# Patient Record
Sex: Female | Born: 1965 | Race: Black or African American | Hispanic: No | Smoking: Current every day smoker
Health system: Southern US, Community
[De-identification: ages and names within clinical notes are randomized; demographics above are authoritative.]

## PROBLEM LIST (undated history)

## (undated) DIAGNOSIS — F419 Anxiety disorder, unspecified: Secondary | ICD-10-CM

## (undated) DIAGNOSIS — J45909 Unspecified asthma, uncomplicated: Secondary | ICD-10-CM

## (undated) DIAGNOSIS — J449 Chronic obstructive pulmonary disease, unspecified: Secondary | ICD-10-CM

## (undated) DIAGNOSIS — F329 Major depressive disorder, single episode, unspecified: Secondary | ICD-10-CM

## (undated) DIAGNOSIS — F32A Depression, unspecified: Secondary | ICD-10-CM

## (undated) HISTORY — PX: KNEE ARTHROPLASTY: SHX992

---

## 2012-01-01 ENCOUNTER — Ambulatory Visit: Payer: Self-pay

## 2012-02-01 ENCOUNTER — Other Ambulatory Visit (HOSPITAL_COMMUNITY): Payer: Self-pay | Admitting: General Practice

## 2012-02-01 ENCOUNTER — Ambulatory Visit (HOSPITAL_COMMUNITY)
Admission: RE | Admit: 2012-02-01 | Discharge: 2012-02-01 | Disposition: A | Discharge status: Home / Self Care | Payer: Disability Insurance | Source: Ambulatory Visit | Attending: General Practice | Admitting: General Practice

## 2012-02-01 DIAGNOSIS — M8569 Other cyst of bone, multiple sites: Secondary | ICD-10-CM

## 2014-10-24 ENCOUNTER — Encounter (HOSPITAL_COMMUNITY): Payer: Self-pay | Admitting: Emergency Medicine

## 2014-10-24 ENCOUNTER — Emergency Department (HOSPITAL_COMMUNITY): Payer: Disability Insurance

## 2014-10-24 ENCOUNTER — Emergency Department (HOSPITAL_COMMUNITY)
Admission: EM | Admit: 2014-10-24 | Discharge: 2014-10-24 | Disposition: A | Discharge status: Home / Self Care | Payer: Disability Insurance | Attending: Emergency Medicine | Admitting: Emergency Medicine

## 2014-10-24 DIAGNOSIS — Z7951 Long term (current) use of inhaled steroids: Secondary | ICD-10-CM | POA: Insufficient documentation

## 2014-10-24 DIAGNOSIS — M79644 Pain in right finger(s): Secondary | ICD-10-CM | POA: Insufficient documentation

## 2014-10-24 DIAGNOSIS — J449 Chronic obstructive pulmonary disease, unspecified: Secondary | ICD-10-CM | POA: Insufficient documentation

## 2014-10-24 DIAGNOSIS — Z720 Tobacco use: Secondary | ICD-10-CM | POA: Insufficient documentation

## 2014-10-24 HISTORY — DX: Unspecified asthma, uncomplicated: J45.909

## 2014-10-24 HISTORY — DX: Chronic obstructive pulmonary disease, unspecified: J44.9

## 2014-10-24 MED ORDER — HYDROCODONE-ACETAMINOPHEN 5-325 MG PO TABS
1.0000 | ORAL_TABLET | Freq: Once | ORAL | Status: AC
  Administered 2014-10-24: 1 via ORAL
  Filled 2014-10-24: qty 1

## 2014-10-24 MED ORDER — IBUPROFEN 800 MG PO TABS
800.0000 mg | ORAL_TABLET | Freq: Once | ORAL | Status: AC
  Administered 2014-10-24: 800 mg via ORAL
  Filled 2014-10-24: qty 1

## 2014-10-24 MED ORDER — HYDROCODONE-ACETAMINOPHEN 5-325 MG PO TABS: ORAL_TABLET | ORAL | Status: DC

## 2014-10-24 MED ORDER — CEPHALEXIN 500 MG PO CAPS
500.0000 mg | ORAL_CAPSULE | Freq: Once | ORAL | Status: AC
  Administered 2014-10-24: 500 mg via ORAL
  Filled 2014-10-24: qty 1

## 2014-10-24 MED ORDER — CEPHALEXIN 500 MG PO CAPS: 500.0000 mg | ORAL_CAPSULE | Freq: Four times a day (QID) | ORAL | Status: DC

## 2014-10-24 MED ORDER — IBUPROFEN 800 MG PO TABS: 800.0000 mg | ORAL_TABLET | Freq: Three times a day (TID) | ORAL | Status: DC

## 2014-10-24 NOTE — ED Notes (Signed)
Pt states she had pain to index finger on right hand last night with swelling and numbness today after what she thinks was an insect bite.

## 2014-10-26 NOTE — ED Provider Notes (Signed)
CSN: [id number]     Arrival date & time 10/24/14  2030 History   First MD Initiated Contact with Patient 10/24/14 2101     Chief Complaint  Patient presents with  . Cellulitis     (Consider location/radiation/quality/duration/timing/severity/associated sxs/prior Treatment) HPI   Kimberly Brandt is a 49 y.o. female who presents to the Emergency Department complaining of pain to the distal right index finger.  symptoms began on the night prior to arrival.  Describes a throbbing pain to the finger that is constant.  She thinks she may have been bitten by an insect although she did not see an insect.  She denies color change, swelling, numbness, or known injury.  She has not tried any therapies prior to arrival.     Past Medical History  Diagnosis Date  . Asthma   . COPD (chronic obstructive pulmonary disease)    No past surgical history on file. History reviewed. No pertinent family history. Social History  Substance Use Topics  . Smoking status: Current Every Day Smoker -- 0.50 packs/day    Types: Cigarettes  . Smokeless tobacco: None  . Alcohol Use: None   OB History    No data available     Review of Systems  Constitutional: Negative for fever and chills.  Genitourinary: Negative for dysuria and difficulty urinating.  Musculoskeletal: Positive for arthralgias (right index finger pain). Negative for joint swelling.  Skin: Negative for color change, rash and wound.  Neurological: Negative for weakness and numbness.  All other systems reviewed and are negative.     Allergies  Review of patient's allergies indicates no known allergies.  Home Medications   Prior to Admission medications   Medication Sig Start Date End Date Taking? Authorizing Provider  albuterol (PROVENTIL HFA;VENTOLIN HFA) 108 (90 BASE) MCG/ACT inhaler Inhale 2 puffs into the lungs every 6 (six) hours as needed for wheezing or shortness of breath.   Yes Historical Provider, MD  Fluticasone-Salmeterol (ADVAIR  DISKUS IN) Inhale 1 puff into the lungs daily.   Yes Historical Provider, MD  PARoxetine (PAXIL) 30 MG tablet Take 30 mg by mouth daily.   Yes Historical Provider, MD  cephALEXin (KEFLEX) 500 MG capsule Take 1 capsule (500 mg total) by mouth 4 (four) times daily. For 7 days 10/24/14   Pauline Aus, PA-C  HYDROcodone-acetaminophen (NORCO/VICODIN) 5-325 MG per tablet Take one tab po q 4-6 hrs prn pain 10/24/14   Daleah Coulson, PA-C  ibuprofen (ADVIL,MOTRIN) 800 MG tablet Take 1 tablet (800 mg total) by mouth 3 (three) times daily. 10/24/14   Drystan Reader, PA-C   BP 134/82 mmHg  Pulse 86  Temp(Src) 98.2 F (36.8 C) (Oral)  Resp 16  Ht  (1.6 m)  Wt 145 lb (65.772 kg)  BMI 25.69 kg/m2  SpO2 100%  LMP 12/20/2011 Physical Exam  Constitutional: She is oriented to person, place, and time. She appears well-developed and well-nourished. No distress.  HENT:  Head: Normocephalic and atraumatic.  Cardiovascular: Normal rate, regular rhythm and normal heart sounds.   Pulmonary/Chest: Effort normal and breath sounds normal.  Musculoskeletal: She exhibits tenderness. She exhibits no edema.  ttp of the distal end of the right index finger without obvious deformities   Radial pulse is brisk, distal sensation intact.  CR< 2 sec.  No erythema, edema or bony deformity.  Patient has full ROM. Skin warm, dry  Neurological: She is alert and oriented to person, place, and time. She exhibits normal muscle tone. Coordination normal.  Skin:  Skin is warm and dry.  Nursing note and vitals reviewed.   ED Course  Procedures (including critical care time) Labs Review Labs Reviewed - No data to display  Imaging Review Dg Finger Index Right  10/24/2014   CLINICAL DATA:  Pain and swelling beginning this morning, possible insect bite. No injury.  EXAM: RIGHT INDEX FINGER 2+V  COMPARISON:  None.  FINDINGS: There is no evidence of acute fracture or dislocation. Tiny corticated bony fragment along the DIP volar  plate may represent old avulsion injury. There is no evidence of arthropathy or other focal bone abnormality. Mild dorsal finger soft tissue swelling without subcutaneous gas or radiopaque foreign bodies.  IMPRESSION: Soft tissue swelling without acute fracture deformity or dislocation.   Electronically Signed   By: Awilda Metro M.D.   On: 10/24/2014 22:47    I have personally reviewed and evaluated these images and lab results as part of my medical decision-making.   EKG Interpretation None      MDM   Final diagnoses:  Pain of finger of right hand    Pt with pain to the right index finger for one day.  No obvious abnormalities of the finger.  NV intact.  Pain with ROM of the DIP.  XR neg.  Reports possible insect bite.  Will treat with keflex and advised pt to elevate, minimal use and close PMD f/u or to return here for any worsening sx's.  Pt agrees to plan.        Pauline Aus, PA-C 10/26/14 2251  Mancel Bale, MD 10/27/14 [id number]

## 2015-03-31 ENCOUNTER — Emergency Department (HOSPITAL_COMMUNITY): Payer: Self-pay

## 2015-03-31 ENCOUNTER — Emergency Department (HOSPITAL_COMMUNITY)
Admission: EM | Admit: 2015-03-31 | Discharge: 2015-03-31 | Disposition: A | Discharge status: Home / Self Care | Payer: Self-pay | Attending: Emergency Medicine | Admitting: Emergency Medicine

## 2015-03-31 ENCOUNTER — Encounter (HOSPITAL_COMMUNITY): Payer: Self-pay | Admitting: Emergency Medicine

## 2015-03-31 DIAGNOSIS — K2100 Gastro-esophageal reflux disease with esophagitis, without bleeding: Secondary | ICD-10-CM

## 2015-03-31 DIAGNOSIS — R079 Chest pain, unspecified: Secondary | ICD-10-CM | POA: Insufficient documentation

## 2015-03-31 DIAGNOSIS — Z7951 Long term (current) use of inhaled steroids: Secondary | ICD-10-CM | POA: Insufficient documentation

## 2015-03-31 DIAGNOSIS — F329 Major depressive disorder, single episode, unspecified: Secondary | ICD-10-CM | POA: Insufficient documentation

## 2015-03-31 DIAGNOSIS — Z792 Long term (current) use of antibiotics: Secondary | ICD-10-CM | POA: Insufficient documentation

## 2015-03-31 DIAGNOSIS — M549 Dorsalgia, unspecified: Secondary | ICD-10-CM | POA: Insufficient documentation

## 2015-03-31 DIAGNOSIS — F17210 Nicotine dependence, cigarettes, uncomplicated: Secondary | ICD-10-CM | POA: Insufficient documentation

## 2015-03-31 DIAGNOSIS — Z791 Long term (current) use of non-steroidal anti-inflammatories (NSAID): Secondary | ICD-10-CM | POA: Insufficient documentation

## 2015-03-31 DIAGNOSIS — Z79899 Other long term (current) drug therapy: Secondary | ICD-10-CM | POA: Insufficient documentation

## 2015-03-31 DIAGNOSIS — F419 Anxiety disorder, unspecified: Secondary | ICD-10-CM | POA: Insufficient documentation

## 2015-03-31 DIAGNOSIS — J441 Chronic obstructive pulmonary disease with (acute) exacerbation: Secondary | ICD-10-CM | POA: Insufficient documentation

## 2015-03-31 HISTORY — DX: Major depressive disorder, single episode, unspecified: F32.9

## 2015-03-31 HISTORY — DX: Depression, unspecified: F32.A

## 2015-03-31 HISTORY — DX: Anxiety disorder, unspecified: F41.9

## 2015-03-31 LAB — TROPONIN I
Troponin I: <0.03 ng/mL (ref <0.031)
Troponin I: <0.03 ng/mL (ref <0.031)

## 2015-03-31 LAB — CBC
HEMATOCRIT: 35.5 % — AB (ref 36.0–46.0)
HEMOGLOBIN: 11.7 g/dL — AB (ref 12.0–15.0)
MCH: 23 pg — AB (ref 26.0–34.0)
MCHC: 33 g/dL (ref 30.0–36.0)
MCV: 69.9 fL — AB (ref 78.0–100.0)
Platelets: 328 10*3/uL (ref 150–400)
RBC: 5.08 MIL/uL (ref 3.87–5.11)
RDW: 15.2 % (ref 11.5–15.5)
WBC: 5.5 10*3/uL (ref 4.0–10.5)

## 2015-03-31 LAB — COMPREHENSIVE METABOLIC PANEL
ALK PHOS: 56 U/L (ref 38–126)
ALT: 21 U/L (ref 14–54)
ANION GAP: 8 (ref 5–15)
AST: 21 U/L (ref 15–41)
Albumin: 4 g/dL (ref 3.5–5.0)
BILIRUBIN TOTAL: 0.9 mg/dL (ref 0.3–1.2)
BUN: 15 mg/dL (ref 6–20)
CALCIUM: 9.2 mg/dL (ref 8.9–10.3)
CO2: 25 mmol/L (ref 22–32)
Chloride: 105 mmol/L (ref 101–111)
Creatinine, Ser: 0.68 mg/dL (ref 0.44–1.00)
GFR calc Af Amer: >60 mL/min (ref >60)
GFR calc non Af Amer: >60 mL/min (ref >60)
Glucose, Bld: 105 mg/dL — ABNORMAL HIGH (ref 65–99)
Potassium: 3.3 mmol/L — ABNORMAL LOW (ref 3.5–5.1)
Sodium: 138 mmol/L (ref 135–145)
Total Protein: 7.7 g/dL (ref 6.5–8.1)

## 2015-03-31 MED ORDER — OMEPRAZOLE 20 MG PO CPDR: 20.0000 mg | DELAYED_RELEASE_CAPSULE | Freq: Every day | ORAL | Status: DC

## 2015-03-31 MED ORDER — PANTOPRAZOLE SODIUM 40 MG IV SOLR
40.0000 mg | Freq: Once | INTRAVENOUS | Status: AC
  Administered 2015-03-31: 40 mg via INTRAVENOUS
  Filled 2015-03-31: qty 40

## 2015-03-31 MED ORDER — GI COCKTAIL ~~LOC~~
30.0000 mL | Freq: Once | ORAL | Status: AC
  Administered 2015-03-31: 30 mL via ORAL
  Filled 2015-03-31: qty 30

## 2015-03-31 NOTE — ED Notes (Signed)
MD James at bedside.  

## 2015-03-31 NOTE — ED Notes (Signed)
Phlebotomy at bedside for second Troponin. 

## 2015-03-31 NOTE — ED Provider Notes (Signed)
CSN: [id number]     Arrival date & time 03/31/15  0845 History   By signing my name below, I, Evon Slack, attest that this documentation has been prepared under the direction and in the presence of Rolland Porter, MD. Electronically Signed: Evon Slack, ED Scribe. 03/31/2015. 9:11 AM.      Chief Complaint  Patient presents with  . Chest Pain   The history is provided by the patient. No language interpreter was used.  HPI Comments: Kimberly Brandt is a 50 y.o. female who presents to the Emergency Department complaining of recurrent sharp-burning intermittent CP onset 10 minutes PTA. She states that each episode last for about 3-5 minutes. Pt states that she had 2 episodes of the CP this morning that both have resolved on their own. Pt states that the pain radiates to her back. Pt reports that she is feeling slightly SOB but states that she has a Hx of asthma and anxiety. Pt doesn't report any medications PTA. Pt denies HX of DVT or PE. Pt denies Hx of cancer. Pt denies fever, nausea or leg swelling.     Past Medical History  Diagnosis Date  . Asthma   . COPD (chronic obstructive pulmonary disease) (HCC)   . Depression   . Anxiety    History reviewed. No pertinent past surgical history. History reviewed. No pertinent family history. Social History  Substance Use Topics  . Smoking status: Current Every Day Smoker -- 0.50 packs/day    Types: Cigarettes  . Smokeless tobacco: None  . Alcohol Use: Yes     Comment: rarely   OB History    No data available     Review of Systems  Constitutional: Negative for fever, chills, diaphoresis, appetite change and fatigue.  HENT: Negative for mouth sores, sore throat and trouble swallowing.   Eyes: Negative for visual disturbance.  Respiratory: Positive for shortness of breath. Negative for cough, chest tightness and wheezing.   Cardiovascular: Positive for chest pain.  Gastrointestinal: Negative for nausea, vomiting, abdominal pain, diarrhea and  abdominal distention.  Endocrine: Negative for polydipsia, polyphagia and polyuria.  Genitourinary: Negative for dysuria, frequency and hematuria.  Musculoskeletal: Positive for back pain. Negative for gait problem.  Skin: Negative for color change, pallor and rash.  Neurological: Negative for dizziness, syncope, light-headedness and headaches.  Hematological: Does not bruise/bleed easily.  Psychiatric/Behavioral: Negative for behavioral problems and confusion.      Allergies  Review of patient's allergies indicates no known allergies.  Home Medications   Prior to Admission medications   Medication Sig Start Date End Date Taking? Authorizing Provider  albuterol (PROVENTIL HFA;VENTOLIN HFA) 108 (90 BASE) MCG/ACT inhaler Inhale 2 puffs into the lungs every 6 (six) hours as needed for wheezing or shortness of breath.   Yes Historical Provider, MD  Fluticasone-Salmeterol (ADVAIR DISKUS IN) Inhale 1 puff into the lungs daily.   Yes Historical Provider, MD  PARoxetine (PAXIL) 30 MG tablet Take 30 mg by mouth daily.   Yes Historical Provider, MD  traMADol (ULTRAM) 50 MG tablet Take 50 mg by mouth 2 (two) times daily as needed for moderate pain.   Yes Historical Provider, MD  traZODone (DESYREL) 50 MG tablet Take 50 mg by mouth at bedtime.   Yes Historical Provider, MD  cephALEXin (KEFLEX) 500 MG capsule Take 1 capsule (500 mg total) by mouth 4 (four) times daily. For 7 days 10/24/14   Pauline Aus, PA-C  HYDROcodone-acetaminophen (NORCO/VICODIN) 5-325 MG per tablet Take one tab po q 4-6  hrs prn pain 10/24/14   Tammy Triplett, PA-C  ibuprofen (ADVIL,MOTRIN) 800 MG tablet Take 1 tablet (800 mg total) by mouth 3 (three) times daily. 10/24/14   Tammy Triplett, PA-C  omeprazole (PRILOSEC) 20 MG capsule Take 1 capsule (20 mg total) by mouth daily. 03/31/15   Rolland Porter, MD   BP 99/68 mmHg  Pulse 57  Temp(Src) 97.9 F (36.6 C) (Oral)  Resp 16  Ht  (1.6 m)  Wt 140 lb (63.504 kg)  BMI 24.81  kg/m2  SpO2 100%  LMP 12/20/2011   Physical Exam  Constitutional: She is oriented to person, place, and time. She appears well-developed and well-nourished. No distress.  Appears anxious and squirming   HENT:  Head: Normocephalic.  Eyes: Conjunctivae are normal. Pupils are equal, round, and reactive to light. No scleral icterus.  Neck: Normal range of motion. Neck supple. No thyromegaly present.  Cardiovascular: Normal rate and regular rhythm.  Exam reveals no gallop and no friction rub.   No murmur heard. Pulmonary/Chest: Effort normal and breath sounds normal. No respiratory distress. She has no wheezes. She has no rales.  Abdominal: Soft. Bowel sounds are normal. She exhibits no distension. There is no tenderness. There is no rebound.  Musculoskeletal: Normal range of motion.  Neurological: She is alert and oriented to person, place, and time.  Skin: Skin is warm and dry. No rash noted.  Psychiatric: She has a normal mood and affect. Her behavior is normal.    ED Course  Procedures (including critical care time) DIAGNOSTIC STUDIES: Oxygen Saturation is 100% on RA, normal by my interpretation.    COORDINATION OF CARE: 9:11 AM-Discussed treatment plan with pt at bedside and pt agreed to plan.    Labs Review Labs Reviewed  CBC - Abnormal; Notable for the following:    Hemoglobin 11.7 (*)    HCT 35.5 (*)    MCV 69.9 (*)    MCH 23.0 (*)    All other components within normal limits  COMPREHENSIVE METABOLIC PANEL - Abnormal; Notable for the following:    Potassium 3.3 (*)    Glucose, Bld 105 (*)    All other components within normal limits  TROPONIN I  TROPONIN I    Imaging Review Dg Chest 2 View  03/31/2015  CLINICAL DATA:  Chest and back pain with shortness of breath today, smoker, COPD, asthma EXAM: CHEST  2 VIEW COMPARISON:  None FINDINGS: Normal heart size, mediastinal contours, and pulmonary vascularity. Lungs clear. No pneumothorax. Bones unremarkable. IMPRESSION:  Normal exam. Electronically Signed   By: Ulyses Southward M.D.   On: 03/31/2015 09:36      EKG Interpretation   Date/Time:  Thursday March 31 2015 08:52:02 EST Ventricular Rate:  61 PR Interval:  189 QRS Duration: 77 QT Interval:  469 QTC Calculation: 472 R Axis:   72 Text Interpretation:  Sinus rhythm Nonspecific T abnormalities, lateral  leads Baseline wander in lead(s) V4 Confirmed by Fayrene Fearing  MD, Jeyda Siebel ([id number])  on 03/31/2015 8:56:43 AM      MDM   Final diagnoses:  Chest pain, unspecified chest pain type  Gastroesophageal reflux disease with esophagitis    EKG normal. Initial and follow troponin I normal. Symptoms consistent with reflux esophagitis.   Medical screening examination/treatment/procedure(s) were performed by non-physician practitioner and as supervising physician I was immediately available for consultation/collaboration.   EKG Interpretation   Date/Time:  Thursday March 31 2015 08:52:02 EST Ventricular Rate:  61 PR Interval:  189 QRS Duration:  77 QT Interval:  469 QTC Calculation: 472 R Axis:   72 Text Interpretation:  Sinus rhythm Nonspecific T abnormalities, lateral  leads Baseline wander in lead(s) V4 Confirmed by Fayrene Fearing  MD, Warden Buffa ([id number])  on 03/31/2015 8:56:43 AM           Rolland Porter, MD 03/31/15 1239

## 2015-03-31 NOTE — ED Notes (Signed)
MD James at bedside updating patient and family. 

## 2015-03-31 NOTE — Discharge Instructions (Signed)
Avoid large meals. No alcohol, tobacco, caffeine, or anti-inflammatory medications. Prilosec once per day. If your symptoms persist you may increase this to twice per day  Gastroesophageal Reflux Disease, Adult Normally, food travels down the esophagus and stays in the stomach to be digested. However, when a person has gastroesophageal reflux disease (GERD), food and stomach acid move back up into the esophagus. When this happens, the esophagus becomes sore and inflamed. Over time, GERD can create small holes (ulcers) in the lining of the esophagus.  CAUSES This condition is caused by a problem with the muscle between the esophagus and the stomach (lower esophageal sphincter, or LES). Normally, the LES muscle closes after food passes through the esophagus to the stomach. When the LES is weakened or abnormal, it does not close properly, and that allows food and stomach acid to go back up into the esophagus. The LES can be weakened by certain dietary substances, medicines, and medical conditions, including:  Tobacco use.  Pregnancy.  Having a hiatal hernia.  Heavy alcohol use.  Certain foods and beverages, such as coffee, chocolate, onions, and peppermint. RISK FACTORS This condition is more likely to develop in:  People who have an increased body weight.  People who have connective tissue disorders.  People who use NSAID medicines. SYMPTOMS Symptoms of this condition include:  Heartburn.  Difficult or painful swallowing.  The feeling of having a lump in the throat.  Abitter taste in the mouth.  Bad breath.  Having a large amount of saliva.  Having an upset or bloated stomach.  Belching.  Chest pain.  Shortness of breath or wheezing.  Ongoing (chronic) cough or a night-time cough.  Wearing away of tooth enamel.  Weight loss. Different conditions can cause chest pain. Make sure to see your health care provider if you experience chest pain. DIAGNOSIS Your health  care provider will take a medical history and perform a physical exam. To determine if you have mild or severe GERD, your health care provider may also monitor how you respond to treatment. You may also have other tests, including:  An endoscopy toexamine your stomach and esophagus with a small camera.  A test thatmeasures the acidity level in your esophagus.  A test thatmeasures how much pressure is on your esophagus.  A barium swallow or modified barium swallow to show the shape, size, and functioning of your esophagus. TREATMENT The goal of treatment is to help relieve your symptoms and to prevent complications. Treatment for this condition may vary depending on how severe your symptoms are. Your health care provider may recommend:  Changes to your diet.  Medicine.  Surgery. HOME CARE INSTRUCTIONS Diet  Follow a diet as recommended by your health care provider. This may involve avoiding foods and drinks such as:  Coffee and tea (with or without caffeine).  Drinks that containalcohol.  Energy drinks and sports drinks.  Carbonated drinks or sodas.  Chocolate and cocoa.  Peppermint and mint flavorings.  Garlic and onions.  Horseradish.  Spicy and acidic foods, including peppers, chili powder, curry powder, vinegar, hot sauces, and barbecue sauce.  Citrus fruit juices and citrus fruits, such as oranges, lemons, and limes.  Tomato-based foods, such as red sauce, chili, salsa, and pizza with red sauce.  Fried and fatty foods, such as donuts, french fries, potato chips, and high-fat dressings.  High-fat meats, such as hot dogs and fatty cuts of red and white meats, such as rib eye steak, sausage, ham, and bacon.  High-fat dairy  items, such as whole milk, butter, and cream cheese.  Eat small, frequent meals instead of large meals.  Avoid drinking large amounts of liquid with your meals.  Avoid eating meals during the 2-3 hours before bedtime.  Avoid lying down  right after you eat.  Do not exercise right after you eat. General Instructions  Pay attention to any changes in your symptoms.  Take over-the-counter and prescription medicines only as told by your health care provider. Do not take aspirin, ibuprofen, or other NSAIDs unless your health care provider told you to do so.  Do not use any tobacco products, including cigarettes, chewing tobacco, and e-cigarettes. If you need help quitting, ask your health care provider.  Wear loose-fitting clothing. Do not wear anything tight around your waist that causes pressure on your abdomen.  Raise (elevate) the head of your bed 6 inches (15cm).  Try to reduce your stress, such as with yoga or meditation. If you need help reducing stress, ask your health care provider.  If you are overweight, reduce your weight to an amount that is healthy for you. Ask your health care provider for guidance about a safe weight loss goal.  Keep all follow-up visits as told by your health care provider. This is important. SEEK MEDICAL CARE IF:  You have new symptoms.  You have unexplained weight loss.  You have difficulty swallowing, or it hurts to swallow.  You have wheezing or a persistent cough.  Your symptoms do not improve with treatment.  You have a hoarse voice. SEEK IMMEDIATE MEDICAL CARE IF:  You have pain in your arms, neck, jaw, teeth, or back.  You feel sweaty, dizzy, or light-headed.  You have chest pain or shortness of breath.  You vomit and your vomit looks like blood or coffee grounds.  You faint.  Your stool is bloody or black.  You cannot swallow, drink, or eat.   This information is not intended to replace advice given to you by your health care provider. Make sure you discuss any questions you have with your health care provider.   Document Released: 12/06/2004 Document Revised: 11/17/2014 Document Reviewed: 06/23/2014 Elsevier Interactive Patient Education [employer].

## 2015-03-31 NOTE — ED Notes (Signed)
Pt states that she has been having chest pain for about 20 minutes pta intermittently with shortness of breath and back pain.

## 2015-03-31 NOTE — ED Notes (Signed)
Updated pt on second Troponin lab draw at 1200. Pt verbalized understanding.

## 2015-05-14 ENCOUNTER — Emergency Department (HOSPITAL_COMMUNITY)
Admission: EM | Admit: 2015-05-14 | Discharge: 2015-05-14 | Disposition: A | Discharge status: Home / Self Care | Payer: Self-pay | Attending: Emergency Medicine | Admitting: Emergency Medicine

## 2015-05-14 ENCOUNTER — Emergency Department (HOSPITAL_COMMUNITY): Payer: Self-pay

## 2015-05-14 ENCOUNTER — Encounter (HOSPITAL_COMMUNITY): Payer: Self-pay

## 2015-05-14 DIAGNOSIS — J40 Bronchitis, not specified as acute or chronic: Secondary | ICD-10-CM | POA: Insufficient documentation

## 2015-05-14 DIAGNOSIS — Z792 Long term (current) use of antibiotics: Secondary | ICD-10-CM | POA: Insufficient documentation

## 2015-05-14 DIAGNOSIS — F17210 Nicotine dependence, cigarettes, uncomplicated: Secondary | ICD-10-CM | POA: Insufficient documentation

## 2015-05-14 DIAGNOSIS — J440 Chronic obstructive pulmonary disease with acute lower respiratory infection: Secondary | ICD-10-CM | POA: Insufficient documentation

## 2015-05-14 DIAGNOSIS — R109 Unspecified abdominal pain: Secondary | ICD-10-CM | POA: Insufficient documentation

## 2015-05-14 MED ORDER — ALBUTEROL SULFATE (2.5 MG/3ML) 0.083% IN NEBU
2.5000 mg | INHALATION_SOLUTION | Freq: Once | RESPIRATORY_TRACT | Status: AC
  Administered 2015-05-14: 2.5 mg via RESPIRATORY_TRACT
  Filled 2015-05-14: qty 3

## 2015-05-14 MED ORDER — AZITHROMYCIN 250 MG PO TABS: ORAL_TABLET | ORAL | Status: DC

## 2015-05-14 MED ORDER — IPRATROPIUM-ALBUTEROL 0.5-2.5 (3) MG/3ML IN SOLN
3.0000 mL | Freq: Once | RESPIRATORY_TRACT | Status: AC
  Administered 2015-05-14: 3 mL via RESPIRATORY_TRACT
  Filled 2015-05-14: qty 3

## 2015-05-14 MED ORDER — ALBUTEROL SULFATE HFA 108 (90 BASE) MCG/ACT IN AERS
2.0000 | INHALATION_SPRAY | RESPIRATORY_TRACT | Status: DC | PRN
  Administered 2015-05-14: 2 via RESPIRATORY_TRACT
  Filled 2015-05-14: qty 6.7

## 2015-05-14 MED ORDER — IBUPROFEN 400 MG PO TABS
600.0000 mg | ORAL_TABLET | Freq: Once | ORAL | Status: AC
  Administered 2015-05-14: 600 mg via ORAL
  Filled 2015-05-14: qty 2

## 2015-05-14 NOTE — ED Notes (Signed)
Pt c/o cough, headache, and body aches x 3 days.

## 2015-05-14 NOTE — Discharge Instructions (Signed)
Drink plenty of fluids and follow up if not improving. °

## 2015-05-14 NOTE — ED Notes (Signed)
Resp made aware of ordered breathing treatment.

## 2015-05-14 NOTE — ED Provider Notes (Signed)
CSN: [id number]     Arrival date & time 05/14/15  0915 History  By signing my name below, I, Placido Sou, attest that this documentation has been prepared under the direction and in the presence of Bethann Berkshire, MD. Electronically Signed: Placido Sou, ED Scribe. 05/14/2015. 10:08 AM.    Chief Complaint  Patient presents with  . URI   Patient is a 50 y.o. female presenting with URI. The history is provided by the patient. No language interpreter was used.  URI Presenting symptoms: cough   Presenting symptoms: no congestion and no fatigue   Severity:  Moderate Onset quality:  Gradual Duration:  3 days Timing:  Constant Progression:  Unchanged Chronicity:  Recurrent Associated symptoms: headaches, myalgias (body aches) and wheezing   Risk factors: chronic respiratory disease    HPI Comments: Kimberly Brandt is a 50 y.o. female who presents to the Emergency Department complaining of constant, moderate, dry cough onset 3 days ago. She reports associated, mild, HA, abd pain when coughing and diffuse body aches. She confirms her PMHx including COPD and still intermittently smokes. Pt denies fevers, chills or any other associated symptoms at this time.    Past Medical History  Diagnosis Date  . Asthma   . COPD (chronic obstructive pulmonary disease) (HCC)   . Depression   . Anxiety    History reviewed. No pertinent past surgical history. No family history on file. Social History  Substance Use Topics  . Smoking status: Current Every Day Smoker -- 0.50 packs/day    Types: Cigarettes  . Smokeless tobacco: None  . Alcohol Use: Yes     Comment: rarely   OB History    No data available     Review of Systems  Constitutional: Negative for chills, appetite change and fatigue.  HENT: Negative for congestion, ear discharge and sinus pressure.   Eyes: Negative for discharge.  Respiratory: Positive for cough and wheezing.   Cardiovascular: Negative for chest pain.  Gastrointestinal:  Positive for abdominal pain. Negative for diarrhea.  Genitourinary: Negative for frequency and hematuria.  Musculoskeletal: Positive for myalgias (body aches). Negative for back pain.  Skin: Negative for rash.  Neurological: Positive for headaches. Negative for seizures.  Psychiatric/Behavioral: Negative for hallucinations.    Allergies  Review of patient's allergies indicates no known allergies.  Home Medications   Prior to Admission medications   Medication Sig Start Date End Date Taking? Authorizing Provider  albuterol (PROVENTIL HFA;VENTOLIN HFA) 108 (90 BASE) MCG/ACT inhaler Inhale 2 puffs into the lungs every 6 (six) hours as needed for wheezing or shortness of breath.    Historical Provider, MD  cephALEXin (KEFLEX) 500 MG capsule Take 1 capsule (500 mg total) by mouth 4 (four) times daily. For 7 days 10/24/14   Tammy Triplett, PA-C  Fluticasone-Salmeterol (ADVAIR DISKUS IN) Inhale 1 puff into the lungs daily.    Historical Provider, MD  HYDROcodone-acetaminophen (NORCO/VICODIN) 5-325 MG per tablet Take one tab po q 4-6 hrs prn pain 10/24/14   Tammy Triplett, PA-C  ibuprofen (ADVIL,MOTRIN) 800 MG tablet Take 1 tablet (800 mg total) by mouth 3 (three) times daily. 10/24/14   Tammy Triplett, PA-C  omeprazole (PRILOSEC) 20 MG capsule Take 1 capsule (20 mg total) by mouth daily. 03/31/15   Rolland Porter, MD  PARoxetine (PAXIL) 30 MG tablet Take 30 mg by mouth daily.    Historical Provider, MD  traMADol (ULTRAM) 50 MG tablet Take 50 mg by mouth 2 (two) times daily as needed for moderate pain.  Historical Provider, MD  traZODone (DESYREL) 50 MG tablet Take 50 mg by mouth at bedtime.    Historical Provider, MD   BP 103/67 mmHg  Pulse 99  Temp(Src) 98.9 F (37.2 C) (Oral)  Resp 20  Ht 5\' 3"  (1.6 m)  Wt 140 lb (63.504 kg)  BMI 24.81 kg/m2  SpO2 100%  LMP 12/20/2011    Physical Exam  Constitutional: She is oriented to person, place, and time. She appears well-developed.  HENT:  Head:  Normocephalic.  Eyes: Conjunctivae and EOM are normal. No scleral icterus.  Neck: Neck supple. No thyromegaly present.  Cardiovascular: Normal rate and regular rhythm.  Exam reveals no gallop and no friction rub.   No murmur heard. Pulmonary/Chest: No stridor. She has wheezes (moderate wheezing bilaterally). She has no rales. She exhibits no tenderness.  Abdominal: She exhibits no distension. There is no tenderness. There is no rebound.  Musculoskeletal: Normal range of motion. She exhibits no edema.  Lymphadenopathy:    She has no cervical adenopathy.  Neurological: She is oriented to person, place, and time. She exhibits normal muscle tone. Coordination normal.  Skin: No rash noted. No erythema.  Psychiatric: She has a normal mood and affect. Her behavior is normal.    ED Course  Procedures  DIAGNOSTIC STUDIES: Oxygen Saturation is 100% on RA, normal by my interpretation.    COORDINATION OF CARE: 10:06 AM Discussed next steps with pt and she agreed to the plan.   Labs Review Labs Reviewed - No data to display  Imaging Review No results found. I have personally reviewed and evaluated these images as part of my medical decision-making.   EKG Interpretation None      MDM   Final diagnoses:  None    Patient with bronchitis and bronchospasm she is discharged with Z-Pak and albuterol inhaler  Bethann BerkshireJoseph Kiera Hussey, MD 05/14/15 1208

## 2015-09-12 ENCOUNTER — Ambulatory Visit (HOSPITAL_COMMUNITY): Payer: Self-pay | Attending: Emergency Medicine

## 2015-09-12 ENCOUNTER — Encounter (HOSPITAL_COMMUNITY): Payer: Self-pay

## 2015-09-12 DIAGNOSIS — R278 Other lack of coordination: Secondary | ICD-10-CM

## 2015-09-12 DIAGNOSIS — M25532 Pain in left wrist: Secondary | ICD-10-CM

## 2015-09-12 NOTE — Patient Instructions (Signed)
  Do exercises 2-3 times a day for 3-4 weeks.  WRIST EXTENSION CURLS - TABLE  Hold a small free weight, rest your forearm on a table and bend your wrist up and down with your palm face down as shown.    Complete 10 times.   ANTERIOR/MIDDLE SCALENE STRETCH - HOLDING CHAIR  While sitting in a chair, hold the seat with one hand. Next, tilt your head to the opposite side and then rotate your head upward. Hold for a stretch. Return to original position and then repeat. Tip your chin upward to intensify the stretch.   Hold 10 seconds. Repeat 1 time.   SCAPULAR RETRACTIONS  Draw your shoulder blades back and down. Complete 10 times.     LEVATOR SCAPULAE STRETCH - GRASPING WRIST  Grasp your arm of the affected side and pull it gently towards the opposite side in front of your body.  Next, tilt your head downward and to the side looking away from the affected side until a stretch is felt.    Hold 10 seconds. Complete 1 time.  WRIST EXTENSOR STRETCH  Use your unaffected hand to bend the affected wrist down as shown.   Keep the elbow straight on the affected side the entire time.  Hold 10 seconds.   WRIST FLEXOR STRETCH  Use your unaffected hand to bend the affected wrist up as shown.   Keep the elbow straight on the affected side the entire time.   Hold 10 seconds.   Wrist flexion/extension  Bend at the wrist to move hand forward and then bend wrist to move hand back like shown    Complete 10 times.

## 2015-09-12 NOTE — Therapy (Signed)
[facility] [address] [place], [place], [address] Phone: [phone]   Fax:  [phone]  Occupational Therapy Evaluation  Patient Details  Name: Kimberly Brandt MRN: [id number] Date of Birth: 05/09/1965 Referring Provider: Smith RobertStephen Kikel, MD  Encounter Date: 09/12/2015      OT End of Session - 09/12/15 1847    Visit Number 1   Number of Visits 1   Authorization Type Medicaid   OT Start Time 1730   OT Stop Time 1815   OT Time Calculation (min) 45 min   Activity Tolerance Patient tolerated treatment well   Behavior During Therapy [facility] for tasks assessed/performed      Past Medical History  Diagnosis Date  . Asthma   . COPD (chronic obstructive pulmonary disease) (HCC)   . Depression   . Anxiety     No past surgical history on file.  There were no vitals filed for this visit.      Subjective Assessment - 09/12/15 1747    Subjective  S: I just want this pain and numbness to go away.   Pertinent History Patient is a 50 y/o female S/P left wrist pain due to carpal tunnel syndrome which began a few months ago. Patient reports that she does have a night time wrist splint although the strap has broken and she needs a new one. Pain and numbness is constant and severe according to patient. Dr. Bryna ColanderKikel has referred patient to occupational therapy for evaluation and treatment.    Limitations Unable to use Left hand for any daily tasks.   Patient Stated Goals To be pain free if possible.   Currently in Pain? Yes   Pain Score 7    Pain Location Wrist   Pain Orientation Left   Pain Descriptors / Indicators Throbbing   Pain Type Acute pain   Pain Radiating Towards radiates from wrist up arm.    Pain Onset More than a month ago   Pain Frequency Constant   Aggravating Factors  Use and movement.   Pain Relieving Factors Nothing helps.   Effect of Pain on Daily Activities Major effect on daily tasks.    Multiple Pain Sites No            [facility] OT Assessment - 09/12/15 1748    Assessment   Diagnosis Left wrist pain (Carpal Tunnel Syndrome)   Referring Provider Smith RobertStephen Kikel, MD   Onset Date --  a few months ago   Prior Therapy None   Precautions   Precautions None   Restrictions   Weight Bearing Restrictions No   Balance Screen   Has the patient fallen in the past 6 months No   Home  Environment   Family/patient expects to be discharged to: Private residence   Prior Function   Level of Independence Independent   Vocation Retired   ADL   ADL comments Severe difficulty using left hand for any task. Patient reports that she is unable to open or hold onto a jar to open using left hand.    Mobility   Mobility Status Independent   Written Expression   Dominant Hand Right   Vision - History   Baseline Vision No visual deficits   Cognition   Overall Cognitive Status Within Functional Limits for tasks assessed   Sensation   Light Touch Impaired by gross assessment   Stereognosis Impaired by gross assessment   Additional Comments Pt reports that feeling that her hand is asleep.  Coordination   Gross Motor Movements are Fluid and Coordinated No   Fine Motor Movements are Fluid and Coordinated No   ROM / Strength   AROM / PROM / Strength PROM;Strength;AROM   AROM   Overall AROM  Within functional limits for tasks performed   AROM Assessment Site Wrist;Forearm;Elbow;Shoulder   Right/Left Shoulder Left   Right/Left Elbow Left   Right/Left Forearm Left   Right/Left Wrist Left   Strength   Overall Strength Comments Assessed seated.   Strength Assessment Site Hand   Right/Left hand Left;Right   Right Hand Grip (lbs) 55   Right Hand Lateral Pinch 16 lbs   Right Hand 3 Point Pinch 14 lbs   Left Hand Grip (lbs) 10   Left Hand Lateral Pinch 4 lbs   Left Hand 3 Point Pinch 2 lbs                         OT Education - 09/12/15 1843    Education provided Yes   Education Details Carpal tunnel  syndrome exercises and stretches. patient was also given a piece of Dycem to assist with holding items when opening.    Person(s) Educated Patient   Methods Explanation;Demonstration;Verbal cues;Handout   Comprehension Verbalized understanding;Returned demonstration          OT Short Term Goals - 09/12/15 1849    OT SHORT TERM GOAL #1   Title Patient will be educated and independent with HEP for left wrist pain.   Time 1   Period Days   Status Achieved                  Plan - 09/12/15 1847    Clinical Impression Statement A: Patient is a 50 y/o female S/P left wrist pain causing increased pain and numbness and decreased strength resulting in difficulty completing daily tasks using LUE. Pt's insurance only covers 1 OT evaluation. Pt is unable to self pay and wishes to be given a HEP to complet at home.     Rehab Potential Good   OT Frequency One time visit   OT Treatment/Interventions Patient/family education   Plan P: 1 time visit with HEP.   Consulted and Agree with Plan of Care Patient      Patient will benefit from skilled therapeutic intervention in order to improve the following deficits and impairments:  Pain, Impaired sensation, Decreased strength, Decreased coordination, Impaired UE functional use  Visit Diagnosis: Pain in left wrist - Plan: Ot plan of care cert/re-cert  Other lack of coordination - Plan: Ot plan of care cert/re-cert    Problem List There are no active problems to display for this patient.   Limmie PatriciaLaura Mikiya Nebergall, OTR/L,CBIS  [phone]  09/12/2015, 6:51 PM  [facility] [address] [place], [place], [address] Phone: [phone]   Fax:  [phone]  Name: Kimberly Brandt MRN: [id number] Date of Birth: 11/16/1965

## 2015-09-28 ENCOUNTER — Ambulatory Visit: Payer: Medicaid Other | Admitting: Orthopedic Surgery

## 2017-12-26 IMAGING — DX DG CHEST 2V
2 series · 2 of 2 positions shown · non-contrast
Comparison: None

CLINICAL DATA: Chest and back pain with shortness of breath today,
smoker, COPD, asthma

EXAM:
CHEST  2 VIEW

[chest pa]
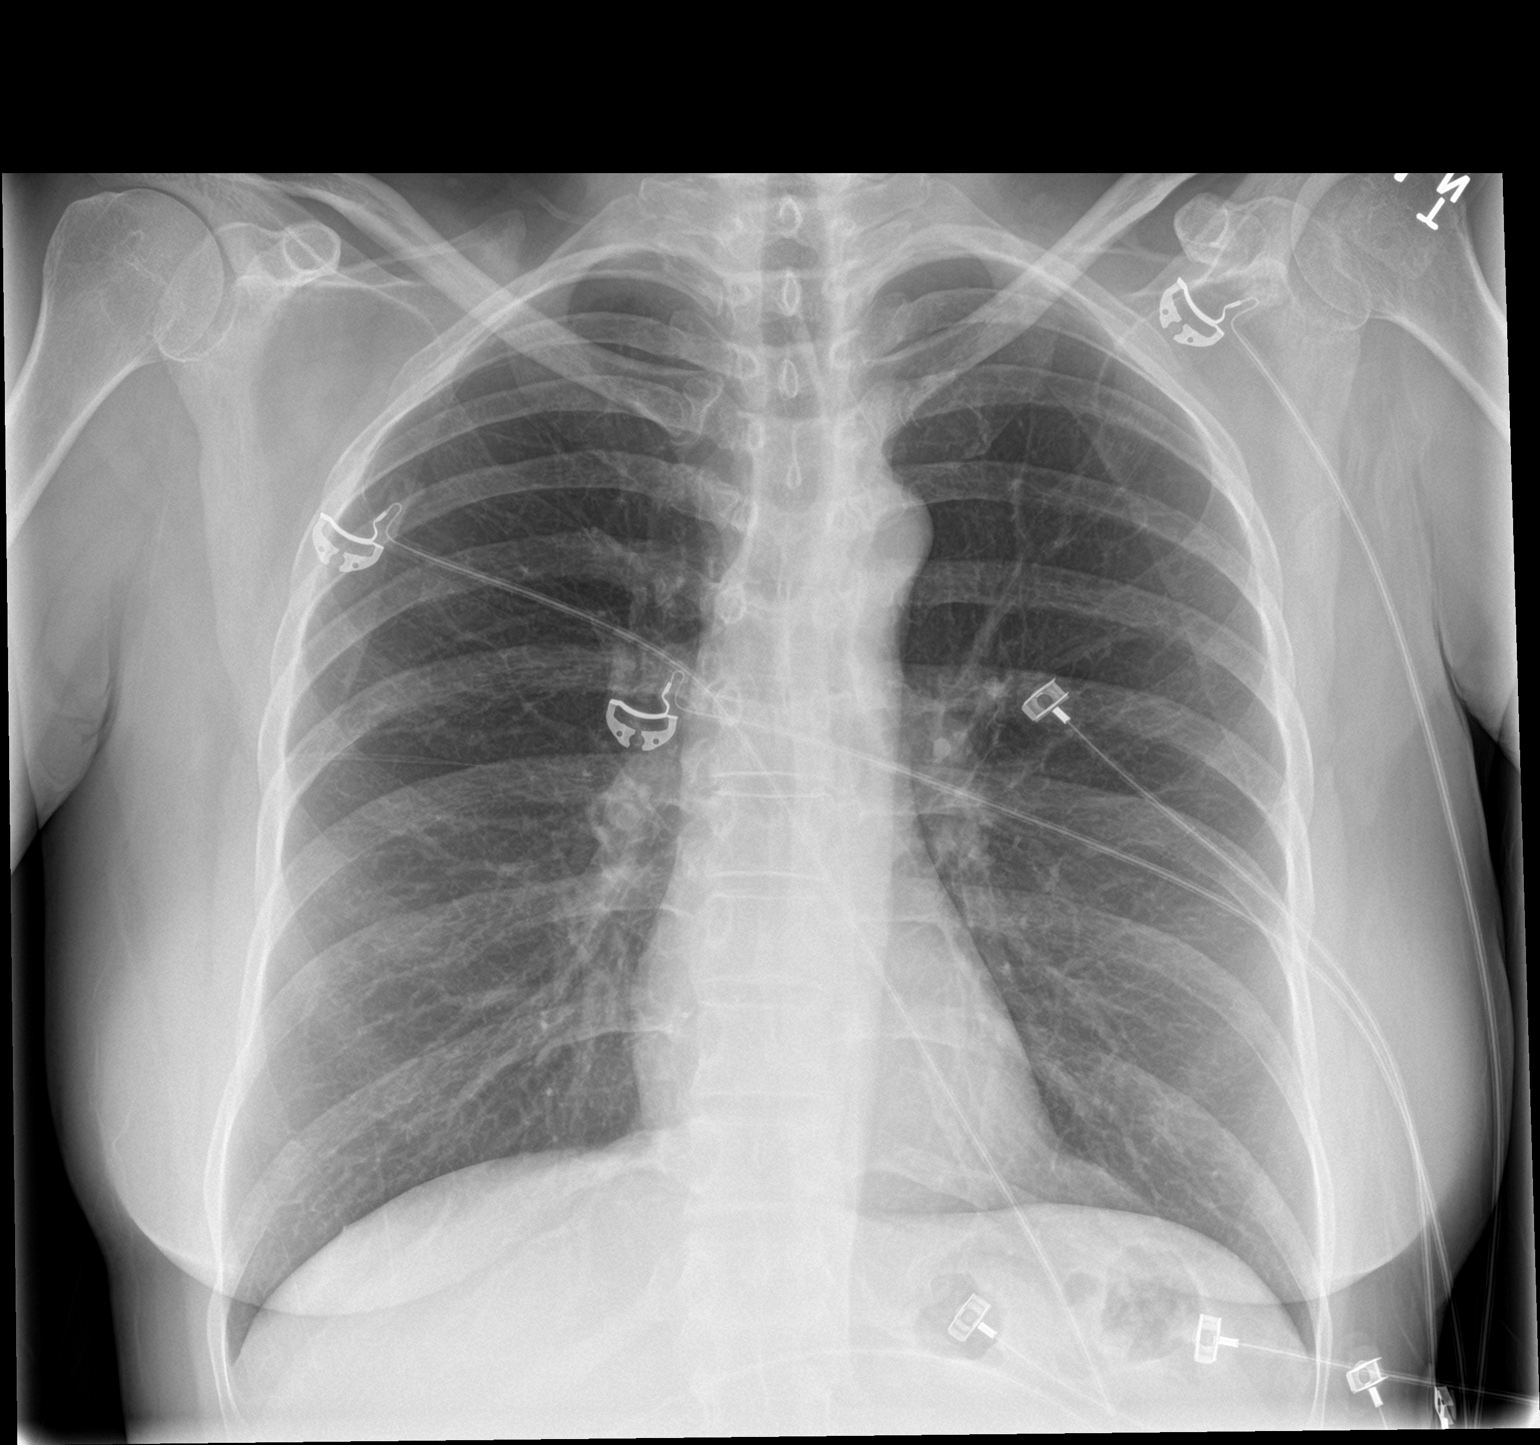

[chest lat]
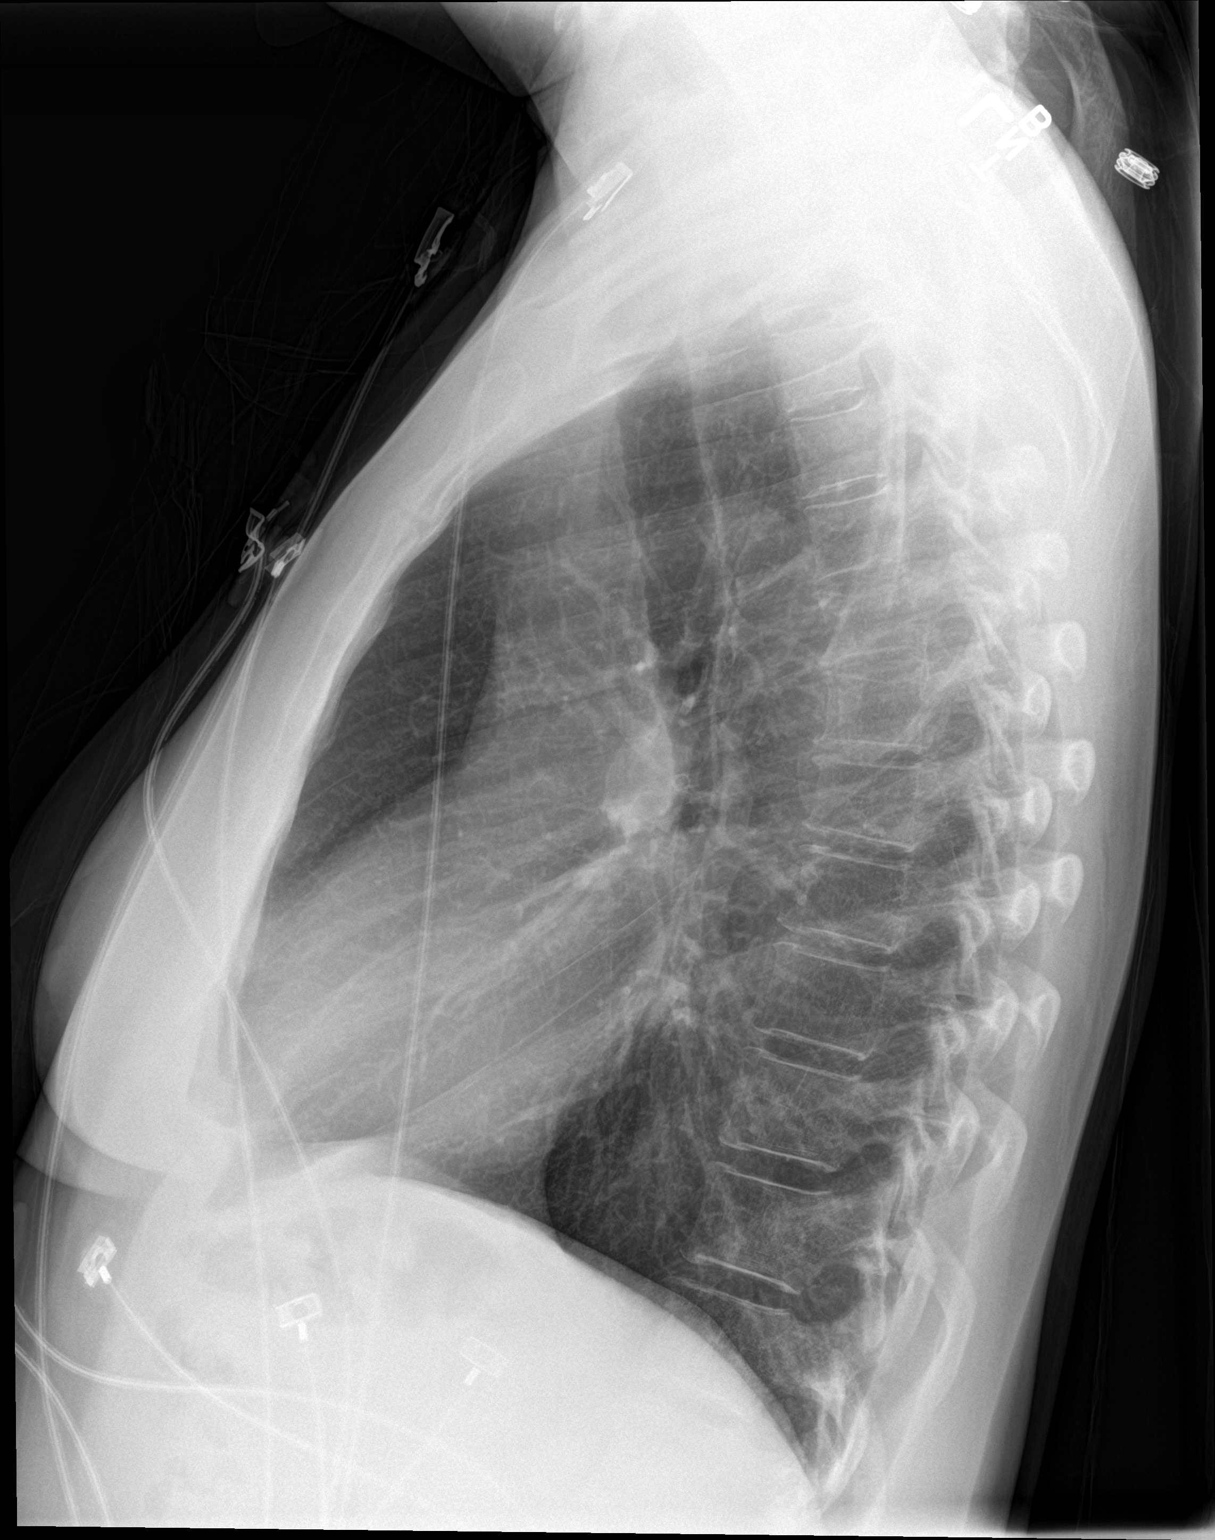

[2 of 2 positions shown; findings below may reference images not displayed]

FINDINGS: Normal heart size, mediastinal contours, and pulmonary vascularity.

Lungs clear.

No pneumothorax.

Bones unremarkable.
IMPRESSION: Normal exam.

## 2018-02-08 IMAGING — DX DG CHEST 2V
2 series · 2 of 2 positions shown · non-contrast
Comparison: 03/30/2005

CLINICAL DATA: Cough, headaches and body aches for 3 days, history
asthma, COPD, smoking

EXAM:
CHEST  2 VIEW

[chest pa]
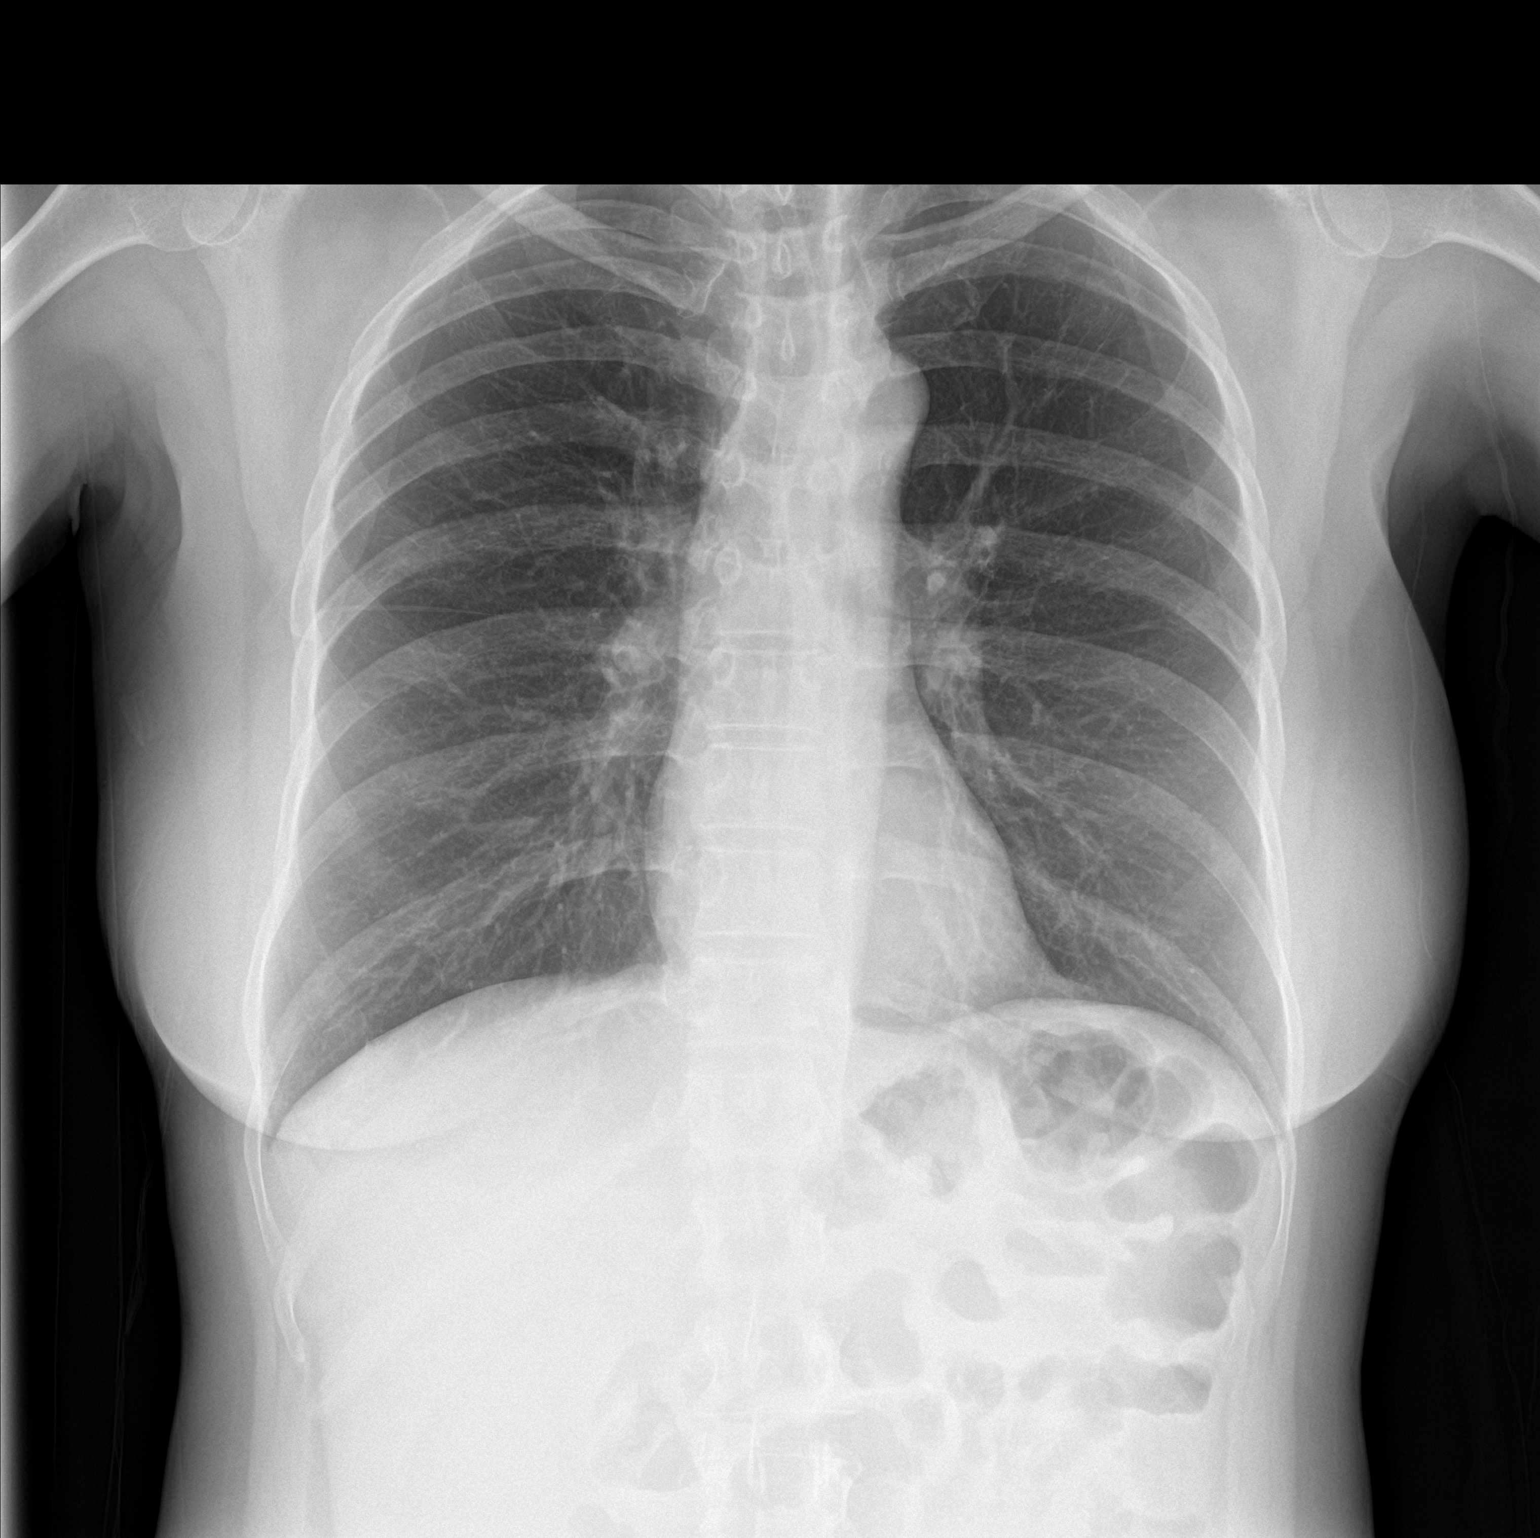

[chest lat]
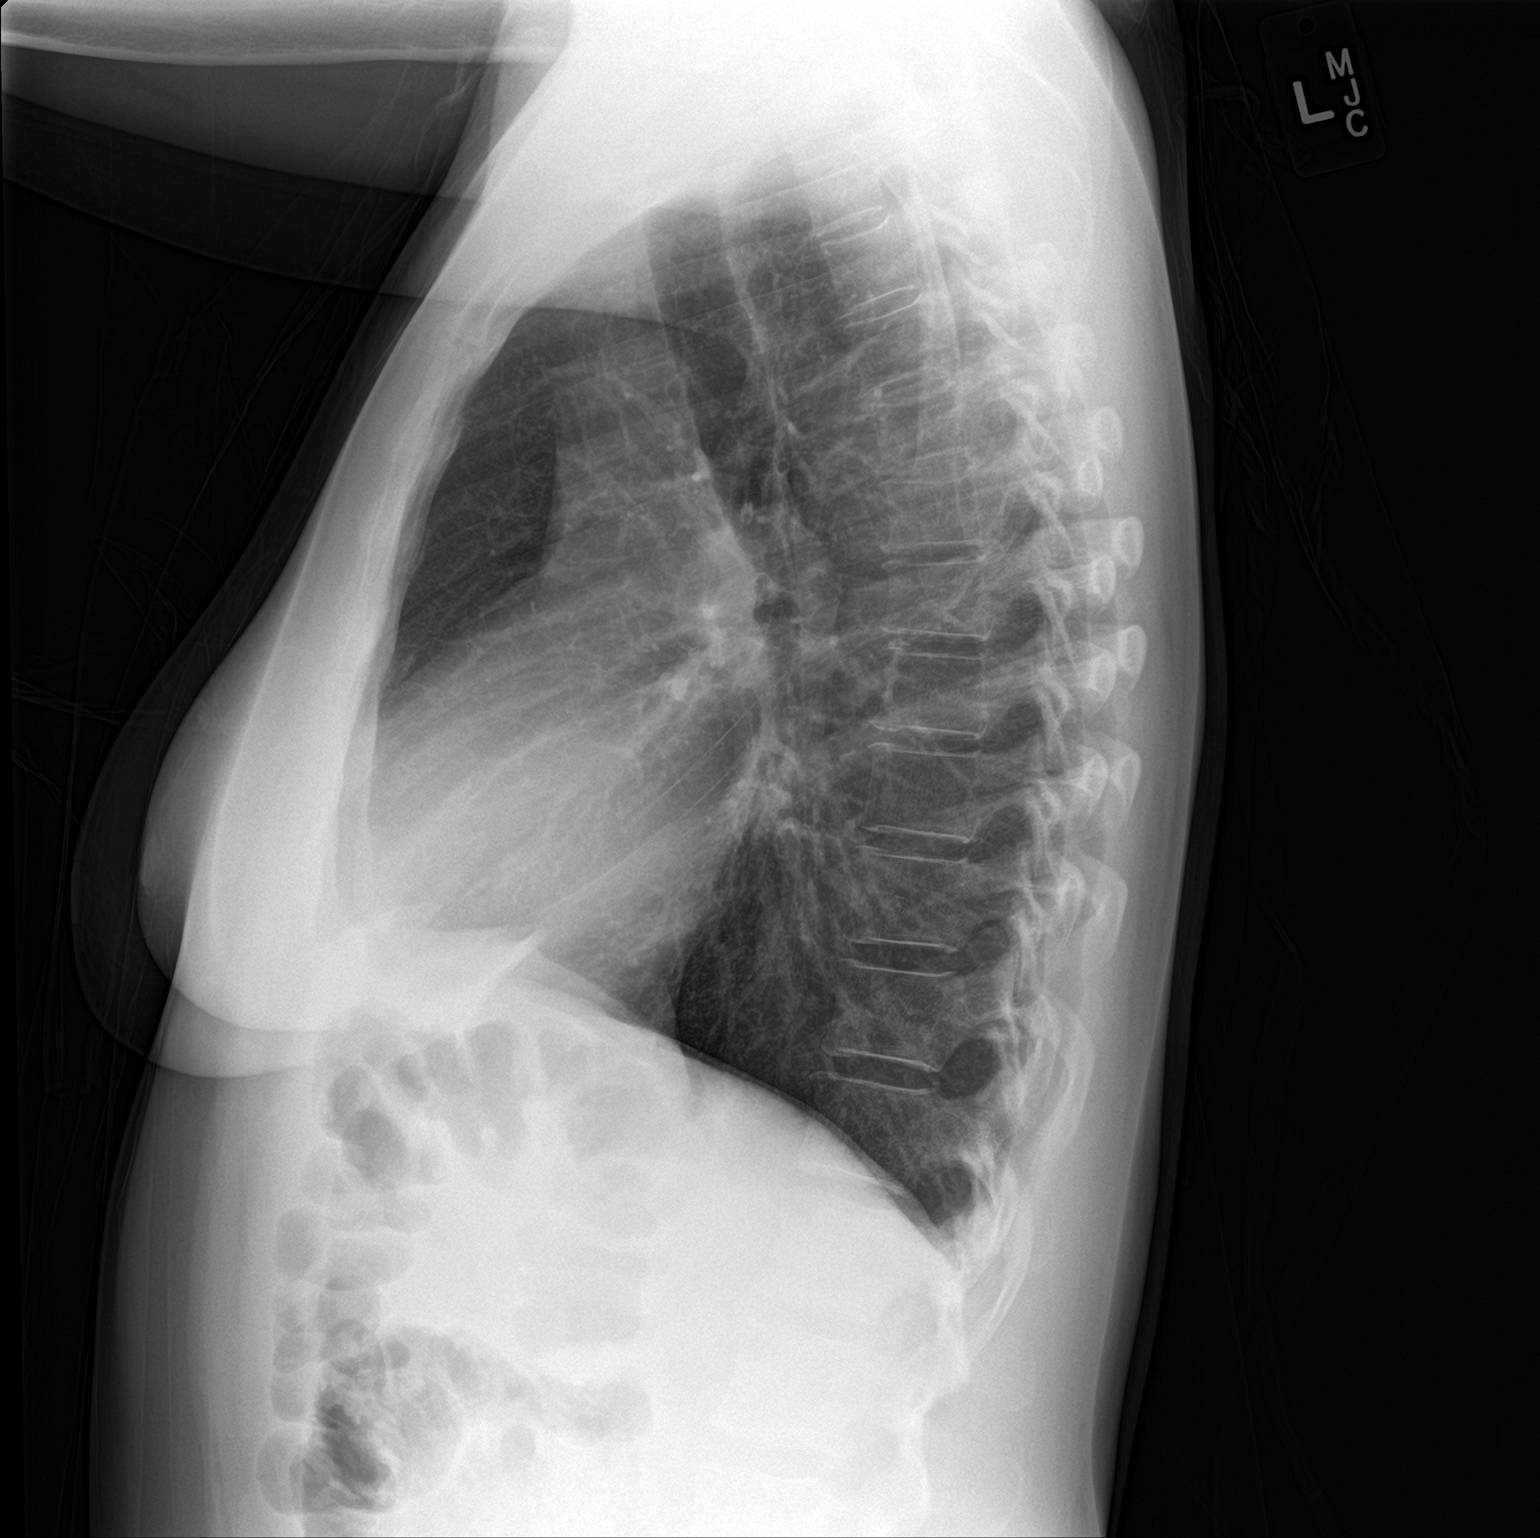

[2 of 2 positions shown; findings below may reference images not displayed]

FINDINGS: Normal heart size, mediastinal contours, and pulmonary vascularity.

Minimal bronchitic changes.

No infiltrate, pleural effusion or pneumothorax.

Bones unremarkable.
IMPRESSION: Minimal bronchitic changes without infiltrate.

## 2019-01-06 ENCOUNTER — Emergency Department (HOSPITAL_COMMUNITY)
Admission: EM | Admit: 2019-01-06 | Discharge: 2019-01-06 | Disposition: A | Discharge status: Home / Self Care | Payer: Self-pay | Attending: Emergency Medicine | Admitting: Emergency Medicine

## 2019-01-06 ENCOUNTER — Other Ambulatory Visit: Payer: Self-pay

## 2019-01-06 ENCOUNTER — Encounter (HOSPITAL_COMMUNITY): Payer: Self-pay

## 2019-01-06 DIAGNOSIS — Z23 Encounter for immunization: Secondary | ICD-10-CM | POA: Insufficient documentation

## 2019-01-06 DIAGNOSIS — J449 Chronic obstructive pulmonary disease, unspecified: Secondary | ICD-10-CM | POA: Insufficient documentation

## 2019-01-06 DIAGNOSIS — S60451A Superficial foreign body of left index finger, initial encounter: Secondary | ICD-10-CM | POA: Insufficient documentation

## 2019-01-06 DIAGNOSIS — Y998 Other external cause status: Secondary | ICD-10-CM | POA: Insufficient documentation

## 2019-01-06 DIAGNOSIS — W268XXA Contact with other sharp object(s), not elsewhere classified, initial encounter: Secondary | ICD-10-CM | POA: Insufficient documentation

## 2019-01-06 DIAGNOSIS — Z96659 Presence of unspecified artificial knee joint: Secondary | ICD-10-CM | POA: Insufficient documentation

## 2019-01-06 DIAGNOSIS — Y92828 Other wilderness area as the place of occurrence of the external cause: Secondary | ICD-10-CM | POA: Insufficient documentation

## 2019-01-06 DIAGNOSIS — S60459A Superficial foreign body of unspecified finger, initial encounter: Secondary | ICD-10-CM

## 2019-01-06 DIAGNOSIS — Z79899 Other long term (current) drug therapy: Secondary | ICD-10-CM | POA: Insufficient documentation

## 2019-01-06 DIAGNOSIS — Y9389 Activity, other specified: Secondary | ICD-10-CM | POA: Insufficient documentation

## 2019-01-06 DIAGNOSIS — F17210 Nicotine dependence, cigarettes, uncomplicated: Secondary | ICD-10-CM | POA: Insufficient documentation

## 2019-01-06 MED ORDER — CIPROFLOXACIN HCL 500 MG PO TABS: 500.0000 mg | ORAL_TABLET | Freq: Two times a day (BID) | ORAL | 0 refills | Status: AC

## 2019-01-06 MED ORDER — TETANUS-DIPHTH-ACELL PERTUSSIS 5-2.5-18.5 LF-MCG/0.5 IM SUSP
0.5000 mL | Freq: Once | INTRAMUSCULAR | Status: AC
  Administered 2019-01-06: 0.5 mL via INTRAMUSCULAR
  Filled 2019-01-06: qty 0.5

## 2019-01-06 MED ORDER — POVIDONE-IODINE 10 % EX SOLN
CUTANEOUS | Status: AC
  Administered 2019-01-06: 19:00:00
  Filled 2019-01-06: qty 15

## 2019-01-06 MED ORDER — LIDOCAINE HCL (PF) 2 % IJ SOLN
5.0000 mL | Freq: Once | INTRAMUSCULAR | Status: AC
  Administered 2019-01-06: 5 mL

## 2019-01-06 MED ORDER — POVIDONE-IODINE 10 % EX SOLN
CUTANEOUS | Status: AC
  Filled 2019-01-06: qty 15

## 2019-01-06 MED ORDER — LIDOCAINE HCL (PF) 1 % IJ SOLN
INTRAMUSCULAR | Status: AC
  Administered 2019-01-06: 19:00:00
  Filled 2019-01-06: qty 2

## 2019-01-06 MED ORDER — HYDROCODONE-ACETAMINOPHEN 5-325 MG PO TABS: 1.0000 | ORAL_TABLET | ORAL | 0 refills | Status: AC | PRN

## 2019-01-06 NOTE — ED Triage Notes (Signed)
Pt presents to ED with fish hook in left index finger happened 2 hours ago.

## 2019-01-06 NOTE — Discharge Instructions (Addendum)
Soak your finger in epsom salt water twice daily for the next 1-2 days.  Take the entire course of the antibiotics prescribed.  Get rechecked for any signs of infection (redness, swelling, worse pain).  You may take the hydrocodone prescribed for pain relief.  This will make you drowsy - do not drive within 4 hours of taking this medication.

## 2019-01-06 NOTE — ED Provider Notes (Signed)
[facility] EMERGENCY DEPARTMENT Provider Note   CSN: [id number] Arrival date & time: 01/06/19  1330     History   Chief Complaint Chief Complaint  Patient presents with  . Foreign Body in Finger    HPI Kimberly Brandt is a 53 y.o. female presenting with a fishing hook in her distal left index finger which occurred just prior to arrival [place].  She is right handed.  She has localized pain, denies numbness or any other complaint.  Tetanus is out of date. She has had no treatments prior to arrival.     The history is provided by the patient.    Past Medical History:  Diagnosis Date  . Anxiety   . Asthma   . COPD (chronic obstructive pulmonary disease) (HCC)   . Depression     There are no active problems to display for this patient.   Past Surgical History:  Procedure Laterality Date  . KNEE ARTHROPLASTY       OB History   No obstetric history on file.      Home Medications    Prior to Admission medications   Medication Sig Start Date End Date Taking? Authorizing Provider  albuterol (PROVENTIL HFA;VENTOLIN HFA) 108 (90 BASE) MCG/ACT inhaler Inhale 2 puffs into the lungs every 6 (six) hours as needed for wheezing or shortness of breath.   Yes [provider]  Fluticasone-Salmeterol (ADVAIR) 250-50 MCG/DOSE AEPB Inhale 1 puff into the lungs 2 (two) times daily.   Yes [provider]  venlafaxine XR (EFFEXOR-XR) 37.5 MG 24 hr capsule Take 37.5 mg by mouth daily. 12/29/18  Yes [provider]  ciprofloxacin (CIPRO) 500 MG tablet Take 1 tablet (500 mg total) by mouth every 12 (twelve) hours. 01/06/19   Burgess Amor, PA-C  HYDROcodone-acetaminophen (NORCO/VICODIN) 5-325 MG tablet Take 1 tablet by mouth every 4 (four) hours as needed. 01/06/19   Burgess Amor, PA-C    Family History No family history on file.  Social History Social History   Tobacco Use  . Smoking status: Current Every Day Smoker   Packs/day: 0.50    Types: Cigarettes  . Smokeless tobacco: Never Used  Substance Use Topics  . Alcohol use: Yes    Comment: rarely  . 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   Review of Systems Review of Systems  Constitutional: Negative for fever.  Musculoskeletal: Positive for arthralgias. Negative for joint swelling and myalgias.  Skin: Positive for wound.  Neurological: Negative for weakness and numbness.     Physical Exam Updated Vital Signs BP 113/77 (BP Location: Right Arm)   Pulse 76   Temp 98.3 F (36.8 C) (Oral)   Resp 18   Ht 5\' 3"  (1.6 m)   Wt 59.9 kg   LMP 12/20/2011   SpO2 99%   BMI 23.38 kg/m   Physical Exam Constitutional:      Appearance: She is well-developed.  HENT:     Head: Atraumatic.  Neck:     Musculoskeletal: Normal range of motion.  Cardiovascular:     Comments: Pulses equal bilaterally Musculoskeletal:        General: Tenderness present.     Comments: Localized tenderness around site of fish hook, volar left index finger, distal phalanx.  Skin:    General: Skin is warm and dry.  Neurological:     Mental  Status: She is alert.     Sensory: No sensory deficit.     Deep Tendon Reflexes: Reflexes normal.      ED Treatments / Results  Labs (all labs ordered are listed, but only abnormal results are displayed) Labs Reviewed - No data to display  EKG None  Radiology No results found.  Procedures .Foreign Body Removal  Date/Time: 01/06/2019 7:00 PM Performed by: Evalee Jefferson, PA-C Authorized by: Evalee Jefferson, PA-C  Consent: Verbal consent obtained. Risks and benefits: risks, benefits and alternatives were discussed Consent given by: patient Patient understanding: patient states understanding of the procedure being performed Patient identity confirmed: verbally with patient Time out: Immediately prior to procedure a "time out" was called to verify the  correct patient, procedure, equipment, support staff and site/side marked as required. Body area: skin Anesthesia: digital block (blocked after betadine scrub applied)  Anesthesia: Local Anesthetic: lidocaine 1% without epinephrine Anesthetic total: 3 mL  Sedation: Patient sedated: no  Removal mechanism: backed hook out after covering barb with a #18 needle. Dressing: dressing applied Tendon involvement: none Depth: subcutaneous Complexity: simple 1 objects recovered. Objects recovered: fish hook Post-procedure assessment: foreign body removed Patient tolerance: patient tolerated the procedure well with no immediate complications   (including critical care time)  Medications Ordered in ED Medications  lidocaine (XYLOCAINE) 2 % injection 5 mL (5 mLs Other Given by Other 01/06/19 1910)  Tdap (BOOSTRIX) injection 0.5 mL (0.5 mLs Intramuscular Given 01/06/19 1737)  lidocaine (PF) (XYLOCAINE) 1 % injection (  Given by Other 01/06/19 1909)  povidone-iodine (BETADINE) 10 % external solution (  Given 01/06/19 1909)     Initial Impression / Assessment and Plan / ED Course  I have reviewed the triage vital signs and the nursing notes.  Pertinent labs & imaging results that were available during my care of the patient were reviewed by me and considered in my medical decision making (see chart for details).        Pt with fishing hook injury, removed, remaining puncture wound.  Flushed wound with saline,  Dressing applied, tetanus updated.  Return precautions/recheck with pcp outlined.   Final Clinical Impressions(s) / ED Diagnoses   Final diagnoses:  Foreign body in skin of finger, initial encounter    ED Discharge Orders         Ordered    ciprofloxacin (CIPRO) 500 MG tablet  Every 12 hours     01/06/19 1914    HYDROcodone-acetaminophen (NORCO/VICODIN) 5-325 MG tablet  Every 4 hours PRN     01/06/19 1914           Evalee Jefferson, Hershal Coria 01/06/19 2023    Veryl Speak,  MD 01/06/19 2231
